# Patient Record
Sex: Female | Born: 1965 | Race: White | Hispanic: No | Marital: Single | Smoking: Never smoker
Health system: Southern US, Community
[De-identification: ages and names within clinical notes are randomized; demographics above are authoritative.]

## PROBLEM LIST (undated history)

## (undated) DIAGNOSIS — I10 Essential (primary) hypertension: Secondary | ICD-10-CM

## (undated) DIAGNOSIS — R109 Unspecified abdominal pain: Secondary | ICD-10-CM

---

## 2013-10-17 ENCOUNTER — Emergency Department (HOSPITAL_COMMUNITY): Payer: BC Managed Care – PPO

## 2013-10-17 ENCOUNTER — Inpatient Hospital Stay (HOSPITAL_COMMUNITY)
Admission: EM | Admit: 2013-10-17 | Discharge: 2013-10-19 | DRG: 897 | Disposition: A | Discharge status: Home / Self Care | Payer: BC Managed Care – PPO | Attending: Internal Medicine | Admitting: Internal Medicine

## 2013-10-17 ENCOUNTER — Encounter (HOSPITAL_COMMUNITY): Payer: Self-pay | Admitting: Emergency Medicine

## 2013-10-17 DIAGNOSIS — I959 Hypotension, unspecified: Secondary | ICD-10-CM | POA: Diagnosis present

## 2013-10-17 DIAGNOSIS — IMO0001 Reserved for inherently not codable concepts without codable children: Secondary | ICD-10-CM | POA: Diagnosis present

## 2013-10-17 DIAGNOSIS — F10929 Alcohol use, unspecified with intoxication, unspecified: Secondary | ICD-10-CM

## 2013-10-17 DIAGNOSIS — R55 Syncope and collapse: Secondary | ICD-10-CM | POA: Diagnosis present

## 2013-10-17 DIAGNOSIS — I498 Other specified cardiac arrhythmias: Secondary | ICD-10-CM | POA: Diagnosis present

## 2013-10-17 DIAGNOSIS — F1020 Alcohol dependence, uncomplicated: Secondary | ICD-10-CM | POA: Diagnosis present

## 2013-10-17 DIAGNOSIS — E860 Dehydration: Secondary | ICD-10-CM | POA: Diagnosis present

## 2013-10-17 DIAGNOSIS — F10229 Alcohol dependence with intoxication, unspecified: Principal | ICD-10-CM | POA: Diagnosis present

## 2013-10-17 DIAGNOSIS — F39 Unspecified mood [affective] disorder: Secondary | ICD-10-CM | POA: Diagnosis present

## 2013-10-17 DIAGNOSIS — D649 Anemia, unspecified: Secondary | ICD-10-CM | POA: Diagnosis present

## 2013-10-17 DIAGNOSIS — N179 Acute kidney failure, unspecified: Secondary | ICD-10-CM | POA: Diagnosis present

## 2013-10-17 DIAGNOSIS — F329 Major depressive disorder, single episode, unspecified: Secondary | ICD-10-CM

## 2013-10-17 DIAGNOSIS — F32A Depression, unspecified: Secondary | ICD-10-CM

## 2013-10-17 DIAGNOSIS — I10 Essential (primary) hypertension: Secondary | ICD-10-CM | POA: Diagnosis present

## 2013-10-17 DIAGNOSIS — R110 Nausea: Secondary | ICD-10-CM | POA: Diagnosis present

## 2013-10-17 HISTORY — DX: Essential (primary) hypertension: I10

## 2013-10-17 LAB — CBC
HCT: 38.5 % (ref 36.0–46.0)
Hemoglobin: 13.2 g/dL (ref 12.0–15.0)
MCH: 32.1 pg (ref 26.0–34.0)
MCHC: 34.3 g/dL (ref 30.0–36.0)
MCV: 93.7 fL (ref 78.0–100.0)
PLATELETS: 301 10*3/uL (ref 150–400)
RBC: 4.11 MIL/uL (ref 3.87–5.11)
RDW: 12.6 % (ref 11.5–15.5)
WBC: 7.1 10*3/uL (ref 4.0–10.5)

## 2013-10-17 LAB — POCT I-STAT, CHEM 8
BUN: 16 mg/dL (ref 6–23)
Calcium, Ion: 1.1 mmol/L — ABNORMAL LOW (ref 1.12–1.23)
Chloride: 101 mEq/L (ref 96–112)
Creatinine, Ser: 1.7 mg/dL — ABNORMAL HIGH (ref 0.50–1.10)
GLUCOSE: 115 mg/dL — AB (ref 70–99)
HEMATOCRIT: 41 % (ref 36.0–46.0)
Hemoglobin: 13.9 g/dL (ref 12.0–15.0)
POTASSIUM: 4.9 meq/L (ref 3.7–5.3)
SODIUM: 138 meq/L (ref 137–147)
TCO2: 24 mmol/L (ref 0–100)

## 2013-10-17 LAB — CG4 I-STAT (LACTIC ACID): Lactic Acid, Venous: 4.6 mmol/L — ABNORMAL HIGH (ref 0.5–2.2)

## 2013-10-17 MED ORDER — SODIUM CHLORIDE 0.9 % IV BOLUS (SEPSIS)
1000.0000 mL | Freq: Once | INTRAVENOUS | Status: AC
  Administered 2013-10-17: 1000 mL via INTRAVENOUS

## 2013-10-17 MED ORDER — SODIUM CHLORIDE 0.9 % IV BOLUS (SEPSIS)
1000.0000 mL | Freq: Once | INTRAVENOUS | Status: AC
  Administered 2013-10-18: 1000 mL via INTRAVENOUS

## 2013-10-17 MED ORDER — ONDANSETRON HCL 4 MG/2ML IJ SOLN
4.0000 mg | Freq: Once | INTRAMUSCULAR | Status: AC
  Administered 2013-10-18: 4 mg via INTRAVENOUS
  Filled 2013-10-17: qty 2

## 2013-10-17 NOTE — ED Notes (Addendum)
Opitz EDP made aware of CG4 Lactic results.

## 2013-10-17 NOTE — ED Notes (Signed)
Bed: RESA Expected date:  Expected time:  Means of arrival:  Comments: EMS dec loc

## 2013-10-17 NOTE — ED Notes (Signed)
EKG given to EDP. Opitz,MD. For review.

## 2013-10-17 NOTE — ED Notes (Signed)
EMS was called after the patient passed out at a concert. Patient was unresponsive and hypotensive when EMS arrived.

## 2013-10-17 NOTE — ED Provider Notes (Signed)
CSN: [id number]     Arrival date & time 10/17/13  2300 History   First MD Initiated Contact with Patient 10/17/13 2301     Chief Complaint  Patient presents with  . Alcohol Intoxication  . Hypotension     (Consider location/radiation/quality/duration/timing/severity/associated sxs/prior Treatment) HPI Hx per EMS and sig other bedside, at a country western music concert tonight, drinking vodka about 8 shots over the course of 4-5 hours when she became altered and "went out" x 2, witnessed by boyfriend.  He does not believe that she hit her head or used any drugs tonight.  EMS was called and PT was noted to be hypotensive. No vomiting or diarrhea, she did not respond to narcan, somewhat more responsive on arrival to the ED, remains hypotensive despite IVFs in route. PT provides minimal history, admit to alcohol use, denies drugs, took her BP medicine yesterday am, denies pain or trauma, feels nauseated.  No past medical history on file. No past surgical history on file. No family history on file. History  Substance Use Topics  . Smoking status: Not on file  . Smokeless tobacco: Not on file  . Alcohol Use: Not on file   OB History   No data available     Review of Systems  Constitutional: Negative for diaphoresis.  Respiratory: Negative for shortness of breath.   Cardiovascular: Negative for chest pain.  Gastrointestinal: Negative for abdominal pain.  Genitourinary: Negative for dysuria.  Musculoskeletal: Negative for back pain and neck stiffness.  Skin: Negative for rash.  Neurological: Negative for seizures.  All other systems reviewed and are negative.      Allergies  Review of patient's allergies indicates not on file.  Home Medications  No current outpatient prescriptions on file. BP 86/54  Pulse 89  Temp(Src) 97.6 F (36.4 C) (Oral)  Resp 18  SpO2 95% Physical Exam  Constitutional: She is oriented to person, place, and time. She appears well-developed and  well-nourished.  HENT:  Head: Normocephalic and atraumatic.  Mouth/Throat: Oropharynx is clear and moist.  Eyes: EOM are normal. Pupils are equal, round, and reactive to light. No scleral icterus.  Neck: Neck supple.  Cardiovascular: Normal rate, regular rhythm and intact distal pulses.   Hypotensive 86/54  Pulmonary/Chest: Effort normal and breath sounds normal. No respiratory distress.  Abdominal: Soft. Bowel sounds are normal. She exhibits no distension and no mass. There is no tenderness. There is no rebound and no guarding.  Musculoskeletal: Normal range of motion. She exhibits no edema and no tenderness.  Neurological: She is alert and oriented to person, place, and time. No cranial nerve deficit.  Speech slow, is alert and oriented, aware that she is in a hospital, not sure which one, does not recall all events tonight.   Skin: Skin is warm and dry.    ED Course  Procedures (including critical care time) Labs Review Labs Reviewed  ETHANOL - Abnormal; Notable for the following:    Alcohol, Ethyl (B) 199 (*)    All other components within normal limits  COMPREHENSIVE METABOLIC PANEL - Abnormal; Notable for the following:    Potassium 3.2 (*)    Glucose, Bld 115 (*)    Creatinine, Ser 1.40 (*)    GFR calc non Af Amer 44 (*)    GFR calc Af Amer 51 (*)    All other components within normal limits  URINALYSIS, ROUTINE W REFLEX MICROSCOPIC - Abnormal; Notable for the following:    Hgb urine dipstick MODERATE (*)  All other components within normal limits  URINE MICROSCOPIC-ADD ON - Abnormal; Notable for the following:    Casts HYALINE CASTS (*)    All other components within normal limits  CG4 I-STAT (LACTIC ACID) - Abnormal; Notable for the following:    Lactic Acid, Venous 4.60 (*)    All other components within normal limits  POCT I-STAT, CHEM 8 - Abnormal; Notable for the following:    Creatinine, Ser 1.70 (*)    Glucose, Bld 115 (*)    Calcium, Ion 1.10 (*)    All  other components within normal limits  CBC  URINE RAPID DRUG SCREEN (HOSP PERFORMED)   Imaging Review Ct Head Wo Contrast  10/17/2013   CLINICAL DATA:  Syncope and  EXAM: CT HEAD WITHOUT CONTRAST  TECHNIQUE: Contiguous axial images were obtained from the base of the skull through the vertex without intravenous contrast.  COMPARISON:  None available  FINDINGS: There is no acute intracranial hemorrhage or infarct. No mass lesion or midline shift. Gray-white matter differentiation is well maintained. Ventricles are normal in size without evidence of hydrocephalus. CSF containing spaces are within normal limits. No extra-axial fluid collection.  The calvarium is intact.  Orbital soft tissues are within normal limits.  The paranasal sinuses and mastoid air cells are well pneumatized and free of fluid.  Scalp soft tissues are unremarkable.  IMPRESSION: No acute intracranial process.   Electronically Signed   By: Rise Mu M.D.   On: 10/17/2013 23:54   Dg Chest Portable 1 View  10/18/2013   CLINICAL DATA:  Alcohol intoxication, hypotension  EXAM: PORTABLE CHEST - 1 VIEW  COMPARISON:  None.  FINDINGS: Very low inspiratory volumes with scattered perihilar and bibasilar atelectasis. Mild diffuse interstitial prominence. Cardiac and mediastinal contours are likely within normal limits given low lung volumes and portable frontal technique. No definite airspace consolidation pleural effusion or pneumothorax. No acute osseous abnormality.  IMPRESSION: Very low inspiratory volumes with scattered perihilar and bibasilar atelectasis. Superimposed infiltrate her aspiration is difficult to exclude entirely.  Recommend dedicated PA and lateral chest x-ray when the patient is clinically able.   Electronically Signed   By: Malachy Moan M.D.   On: 10/18/2013 00:38     Date: 10/17/2013  Rate: 87  Rhythm: normal sinus rhythm  QRS Axis: normal  Intervals: normal  ST/T Wave abnormalities: nonspecific ST  changes  Conduction Disutrbances:none  Narrative Interpretation:   Old EKG Reviewed: none available   CRITICAL CARE Performed by: Filomena Pokorney Total critical care time: 45 Critical care time was exclusive of separately billable procedures and treating other patients. Critical care was necessary to treat or prevent imminent or life-threatening deterioration. Critical care was time spent personally by me on the following activities: development of treatment plan with patient and/or surrogate as well as nursing, discussions with consultants, evaluation of patient's response to treatment, examination of patient, obtaining history from patient or surrogate, ordering and performing treatments and interventions, ordering and review of laboratory studies, ordering and review of radiographic studies, pulse oximetry and re-evaluation of patient's condition. IV zofran. IV fluids provided for hypertension x3 L, remains hypotensive systolic blood pressure 88-90s. Discussed with hospitalist on-call, Dr. Allena Katz. Given clinical presentation, alcohol consumption, and patient slowly becoming more arousable and continues to deny any symptoms, will continue IV fluids and observed in the ED. 5:15 AM heart rate 103, systolic blood pressure 91 after 5 L of IV fluids. No vomiting in ED. No ABD pain. No abdominal tenderness on serial  exams. Plan admit telemetry observation.    MDM   Diagnosis: alcohol intoxication, hypotension  IV fluid resuscitation, remains borderline hypotensive EKG, chest x-ray, labs, UA, chest x-ray, CT brain MED admit    Sunnie Nielsen, MD 10/18/13 [phone]

## 2013-10-18 ENCOUNTER — Observation Stay (HOSPITAL_COMMUNITY): Payer: BC Managed Care – PPO

## 2013-10-18 ENCOUNTER — Encounter (HOSPITAL_COMMUNITY): Payer: Self-pay | Admitting: Unknown Physician Specialty

## 2013-10-18 DIAGNOSIS — I959 Hypotension, unspecified: Secondary | ICD-10-CM | POA: Diagnosis present

## 2013-10-18 DIAGNOSIS — F1010 Alcohol abuse, uncomplicated: Secondary | ICD-10-CM

## 2013-10-18 DIAGNOSIS — I10 Essential (primary) hypertension: Secondary | ICD-10-CM

## 2013-10-18 LAB — PRO B NATRIURETIC PEPTIDE: Pro B Natriuretic peptide (BNP): 138.1 pg/mL — ABNORMAL HIGH (ref 0–125)

## 2013-10-18 LAB — COMPREHENSIVE METABOLIC PANEL
ALK PHOS: 71 U/L (ref 39–117)
ALT: 18 U/L (ref 0–35)
ALT: 16 U/L (ref 0–35)
AST: 23 U/L (ref 0–37)
AST: 20 U/L (ref 0–37)
Albumin: 4 g/dL (ref 3.5–5.2)
Albumin: 3.7 g/dL (ref 3.5–5.2)
Alkaline Phosphatase: 72 U/L (ref 39–117)
BILIRUBIN TOTAL: 0.3 mg/dL (ref 0.3–1.2)
BUN: 12 mg/dL (ref 6–23)
BUN: 8 mg/dL (ref 6–23)
CALCIUM: 8.2 mg/dL — AB (ref 8.4–10.5)
CO2: 19 meq/L (ref 19–32)
CO2: 20 meq/L (ref 19–32)
CREATININE: 0.95 mg/dL (ref 0.50–1.10)
Calcium: 9.4 mg/dL (ref 8.4–10.5)
Chloride: 99 mEq/L (ref 96–112)
Chloride: 104 mEq/L (ref 96–112)
Creatinine, Ser: 1.4 mg/dL — ABNORMAL HIGH (ref 0.50–1.10)
GFR calc Af Amer: 81 mL/min — ABNORMAL LOW (ref >90)
GFR calc non Af Amer: 70 mL/min — ABNORMAL LOW (ref >90)
GFR, EST AFRICAN AMERICAN: 51 mL/min — AB (ref >90)
GFR, EST NON AFRICAN AMERICAN: 44 mL/min — AB (ref >90)
GLUCOSE: 115 mg/dL — AB (ref 70–99)
Glucose, Bld: 82 mg/dL (ref 70–99)
Potassium: 3.2 mEq/L — ABNORMAL LOW (ref 3.7–5.3)
Potassium: 3.8 mEq/L (ref 3.7–5.3)
Sodium: 138 mEq/L (ref 137–147)
Sodium: 140 mEq/L (ref 137–147)
TOTAL PROTEIN: 6.9 g/dL (ref 6.0–8.3)
Total Bilirubin: 0.3 mg/dL (ref 0.3–1.2)
Total Protein: 6.6 g/dL (ref 6.0–8.3)

## 2013-10-18 LAB — URINALYSIS, ROUTINE W REFLEX MICROSCOPIC
Bilirubin Urine: NEGATIVE
Glucose, UA: NEGATIVE mg/dL
Ketones, ur: NEGATIVE mg/dL
LEUKOCYTES UA: NEGATIVE
Nitrite: NEGATIVE
Protein, ur: NEGATIVE mg/dL
SPECIFIC GRAVITY, URINE: 1.013 (ref 1.005–1.030)
UROBILINOGEN UA: 0.2 mg/dL (ref 0.0–1.0)
pH: 6 (ref 5.0–8.0)

## 2013-10-18 LAB — CBC WITH DIFFERENTIAL/PLATELET
BASOS ABS: 0 10*3/uL (ref 0.0–0.1)
Basophils Relative: 0 % (ref 0–1)
EOS PCT: 0 % (ref 0–5)
Eosinophils Absolute: 0 10*3/uL (ref 0.0–0.7)
HCT: 34.2 % — ABNORMAL LOW (ref 36.0–46.0)
Hemoglobin: 11.8 g/dL — ABNORMAL LOW (ref 12.0–15.0)
LYMPHS PCT: 20 % (ref 12–46)
Lymphs Abs: 1.6 10*3/uL (ref 0.7–4.0)
MCH: 32 pg (ref 26.0–34.0)
MCHC: 34.5 g/dL (ref 30.0–36.0)
MCV: 92.7 fL (ref 78.0–100.0)
MONO ABS: 0.3 10*3/uL (ref 0.1–1.0)
MONOS PCT: 5 % (ref 3–12)
Neutro Abs: 5.7 10*3/uL (ref 1.7–7.7)
Neutrophils Relative %: 74 % (ref 43–77)
Platelets: 307 10*3/uL (ref 150–400)
RBC: 3.69 MIL/uL — ABNORMAL LOW (ref 3.87–5.11)
RDW: 12.6 % (ref 11.5–15.5)
WBC: 7.6 10*3/uL (ref 4.0–10.5)

## 2013-10-18 LAB — RAPID URINE DRUG SCREEN, HOSP PERFORMED
AMPHETAMINES: NOT DETECTED
Barbiturates: NOT DETECTED
Benzodiazepines: NOT DETECTED
COCAINE: NOT DETECTED
OPIATES: NOT DETECTED
Tetrahydrocannabinol: NOT DETECTED

## 2013-10-18 LAB — ETHANOL: Alcohol, Ethyl (B): 199 mg/dL — ABNORMAL HIGH (ref 0–11)

## 2013-10-18 LAB — TROPONIN I
Troponin I: <0.3 ng/mL (ref <0.30)
Troponin I: <0.3 ng/mL (ref <0.30)

## 2013-10-18 LAB — TSH: TSH: 2.375 u[IU]/mL (ref 0.350–4.500)

## 2013-10-18 LAB — LIPASE, BLOOD: Lipase: 67 U/L — ABNORMAL HIGH (ref 11–59)

## 2013-10-18 LAB — LACTIC ACID, PLASMA: Lactic Acid, Venous: 2.2 mmol/L (ref 0.5–2.2)

## 2013-10-18 LAB — URINE MICROSCOPIC-ADD ON

## 2013-10-18 LAB — AMYLASE: AMYLASE: 96 U/L (ref 0–105)

## 2013-10-18 MED ORDER — ONDANSETRON HCL 4 MG PO TABS: 4.0000 mg | ORAL_TABLET | Freq: Four times a day (QID) | ORAL | Status: DC | PRN

## 2013-10-18 MED ORDER — SODIUM CHLORIDE 0.9 % IJ SOLN
3.0000 mL | Freq: Two times a day (BID) | INTRAMUSCULAR | Status: DC
  Administered 2013-10-18: 3 mL via INTRAVENOUS

## 2013-10-18 MED ORDER — HEPARIN SODIUM (PORCINE) 5000 UNIT/ML IJ SOLN
5000.0000 [IU] | Freq: Three times a day (TID) | INTRAMUSCULAR | Status: DC
  Administered 2013-10-18: 5000 [IU] via SUBCUTANEOUS
  Filled 2013-10-18 (×4): qty 1

## 2013-10-18 MED ORDER — ONDANSETRON HCL 4 MG/2ML IJ SOLN: 4.0000 mg | Freq: Four times a day (QID) | INTRAMUSCULAR | Status: DC | PRN

## 2013-10-18 MED ORDER — THIAMINE HCL 100 MG/ML IJ SOLN
100.0000 mg | Freq: Every day | INTRAMUSCULAR | Status: DC
  Filled 2013-10-18 (×2): qty 1

## 2013-10-18 MED ORDER — POTASSIUM CHLORIDE 10 MEQ/100ML IV SOLN
10.0000 meq | Freq: Once | INTRAVENOUS | Status: AC
  Administered 2013-10-18: 10 meq via INTRAVENOUS
  Filled 2013-10-18: qty 100

## 2013-10-18 MED ORDER — SODIUM CHLORIDE 0.9 % IV SOLN
INTRAVENOUS | Status: DC
  Administered 2013-10-18 – 2013-10-19 (×2): via INTRAVENOUS

## 2013-10-18 MED ORDER — LORAZEPAM 2 MG/ML IJ SOLN: 1.0000 mg | Freq: Four times a day (QID) | INTRAMUSCULAR | Status: DC | PRN

## 2013-10-18 MED ORDER — VITAMIN B-1 100 MG PO TABS
100.0000 mg | ORAL_TABLET | Freq: Every day | ORAL | Status: DC
  Administered 2013-10-18 – 2013-10-19 (×2): 100 mg via ORAL
  Filled 2013-10-18 (×2): qty 1

## 2013-10-18 MED ORDER — ACETAMINOPHEN 325 MG PO TABS: 650.0000 mg | ORAL_TABLET | Freq: Four times a day (QID) | ORAL | Status: DC | PRN

## 2013-10-18 MED ORDER — FOLIC ACID 1 MG PO TABS
1.0000 mg | ORAL_TABLET | Freq: Every day | ORAL | Status: DC
  Administered 2013-10-18 – 2013-10-19 (×2): 1 mg via ORAL
  Filled 2013-10-18 (×2): qty 1

## 2013-10-18 MED ORDER — ADULT MULTIVITAMIN W/MINERALS CH
1.0000 | ORAL_TABLET | Freq: Every day | ORAL | Status: DC
  Administered 2013-10-18 – 2013-10-19 (×2): 1 via ORAL
  Filled 2013-10-18 (×2): qty 1

## 2013-10-18 MED ORDER — SODIUM CHLORIDE 0.9 % IV SOLN
INTRAVENOUS | Status: DC
  Administered 2013-10-18: 06:00:00 via INTRAVENOUS

## 2013-10-18 MED ORDER — LORAZEPAM 1 MG PO TABS: 1.0000 mg | ORAL_TABLET | Freq: Four times a day (QID) | ORAL | Status: DC | PRN

## 2013-10-18 MED ORDER — SODIUM CHLORIDE 0.9 % IV SOLN: INTRAVENOUS | Status: DC

## 2013-10-18 MED ORDER — VENLAFAXINE HCL ER 75 MG PO CP24
75.0000 mg | ORAL_CAPSULE | Freq: Every day | ORAL | Status: DC
  Administered 2013-10-18 – 2013-10-19 (×2): 75 mg via ORAL
  Filled 2013-10-18 (×2): qty 1

## 2013-10-18 MED ORDER — KETOROLAC TROMETHAMINE 30 MG/ML IJ SOLN
30.0000 mg | Freq: Once | INTRAMUSCULAR | Status: AC
  Administered 2013-10-18: 30 mg via INTRAVENOUS
  Filled 2013-10-18: qty 1

## 2013-10-18 MED ORDER — ACETAMINOPHEN 650 MG RE SUPP: 650.0000 mg | Freq: Four times a day (QID) | RECTAL | Status: DC | PRN

## 2013-10-18 NOTE — ED Notes (Signed)
Patient transported to X-ray 

## 2013-10-18 NOTE — ED Notes (Signed)
Attempted to call report to floor 

## 2013-10-18 NOTE — ED Notes (Addendum)
Notified RN, Cheryl Tapia pt. Blood pressure low 86/54. MD, Dierdre Highmanpitz made aware.

## 2013-10-18 NOTE — ED Notes (Signed)
Will recollect Lactic Acid

## 2013-10-18 NOTE — ED Notes (Signed)
MD Devine at bedside 

## 2013-10-18 NOTE — H&P (Addendum)
[facility] History and Physical  Patient: Cheryl PressmanJennifer Tapia  XBJ:[id number]  DOB: 11-28-1965  DOS: the patient was seen and examined on 10/18/2013 PCP: No primary provider on file.  Chief Complaint: Syncope  HPI: Cheryl PressmanJennifer Tapia is a 48 y.o. female with Past medical history of hypertension and mood disorder. The patient is coming from home The patient presented with an episode of syncope. She was at a concert and has been drinking alcohol since afternoon. She had 4-6 workup shocks and 1 beer. She was at a concert in the afternoon and was standing MC suddenly collapsed. This was a witnessed event and her significant other was with her who did not witness any seizure like activity, incontinence of bowel or bladder, prior confusion. The patient denies using any illicit drugs. She has taken her antihypertensive medication. She mentioned at her baseline her blood pressure is generally not well controlled. She mentions she had a history of pancreas issues in the past and has been given tramadol which she has taken on Tuesday and Wednesday as she was having a routine abdominal pain. She describes her pain due to pancreas as epigastric in region and radiating to her back and feels like burning. She denies any nausea vomiting or any type of abdominal pain at this point on my evaluation. Pt denies any fever, chills, headache, cough, chest pain, palpitation, shortness of breath, orthopnea, PND, nausea, vomiting, abdominal pain, diarrhea, constipation, active bleeding, burning urination, dizziness, pedal edema,  focal neurological deficit.  Her last menstrual period was 2 years ago.  Review of Systems: as mentioned in the history of present illness.  A Comprehensive review of the other systems is negative.  Past Medical History  Diagnosis Date  . Hypertension    No past surgical history on file. Social History:  reports that she has never smoked. She does not have any smokeless tobacco history on  file. She reports that she drinks about 6.6 ounces of alcohol per week. She reports that she does not use illicit drugs. Independent for most of her  ADL.  No Known Allergies  No family history on file.  Prior to Admission medications   Medication Sig Start Date End Date Taking? Authorizing Provider  Olmesartan-Amlodipine-HCTZ (TRIBENZOR PO) Take by mouth.   Yes Historical Provider, MD  PRESCRIPTION MEDICATION    Yes Historical Provider, MD  venlafaxine XR (EFFEXOR-XR) 75 MG 24 hr capsule Take 75 mg by mouth daily with breakfast.   Yes Historical Provider, MD    Physical Exam: Filed Vitals:   10/18/13 0407 10/18/13 0415 10/18/13 0430 10/18/13 0445  BP: 98/58 93/56 88/56  99/60  Pulse:  121 105 103  Temp:      TempSrc:      Resp:  17 18 15   SpO2:  96% 97% 98%    General: Alert, Awake and Oriented to Time, Place and Person. Appear in moderate distress Eyes: PERRL ENT: Oral Mucosa clear dry. Neck:  no  JVD Cardiovascular: S1 and S2 Present,  No  Murmur, Peripheral Pulses Present Respiratory: Bilateral Air entry equal and Decreased, Clear to Auscultation,   no  Crackles, no  wheezes Abdomen: Bowel Sound Present, Soft and Non tender Skin:  no  Rash Extremities:  no  Pedal edema,  no  calf tenderness Neurologic: Grossly Unremarkable.  Labs on Admission:  CBC:  Recent Labs Lab 10/17/13 2315 10/17/13 2347  WBC 7.1  --   HGB 13.2 13.9  HCT 38.5 41.0  MCV 93.7  --   PLT  301  --     CMP     Component Value Date/Time   NA 138 10/17/2013 2347   K 4.9 10/17/2013 2347   CL 101 10/17/2013 2347   CO2 19 10/17/2013 2315   GLUCOSE 115* 10/17/2013 2347   BUN 16 10/17/2013 2347   CREATININE 1.70* 10/17/2013 2347   CALCIUM 9.4 10/17/2013 2315   PROT 6.9 10/17/2013 2315   ALBUMIN 4.0 10/17/2013 2315   AST 23 10/17/2013 2315   ALT 18 10/17/2013 2315   ALKPHOS 72 10/17/2013 2315   BILITOT 0.3 10/17/2013 2315   GFRNONAA 44* 10/17/2013 2315   GFRAA 51* 10/17/2013 2315    No results found  for this basename: LIPASE, AMYLASE,  in the last 168 hours No results found for this basename: AMMONIA,  in the last 168 hours  No results found for this basename: CKTOTAL, CKMB, CKMBINDEX, TROPONINI,  in the last 168 hours BNP (last 3 results) No results found for this basename: PROBNP,  in the last 8760 hours  Radiological Exams on Admission: Ct Head Wo Contrast  10/17/2013   CLINICAL DATA:  Syncope and  EXAM: CT HEAD WITHOUT CONTRAST  TECHNIQUE: Contiguous axial images were obtained from the base of the skull through the vertex without intravenous contrast.  COMPARISON:  None available  FINDINGS: There is no acute intracranial hemorrhage or infarct. No mass lesion or midline shift. Gray-white matter differentiation is well maintained. Ventricles are normal in size without evidence of hydrocephalus. CSF containing spaces are within normal limits. No extra-axial fluid collection.  The calvarium is intact.  Orbital soft tissues are within normal limits.  The paranasal sinuses and mastoid air cells are well pneumatized and free of fluid.  Scalp soft tissues are unremarkable.  IMPRESSION: No acute intracranial process.   Electronically Signed   By: Rise Mu M.D.   On: 10/17/2013 23:54   Dg Chest Portable 1 View  10/18/2013   CLINICAL DATA:  Alcohol intoxication, hypotension  EXAM: PORTABLE CHEST - 1 VIEW  COMPARISON:  None.  FINDINGS: Very low inspiratory volumes with scattered perihilar and bibasilar atelectasis. Mild diffuse interstitial prominence. Cardiac and mediastinal contours are likely within normal limits given low lung volumes and portable frontal technique. No definite airspace consolidation pleural effusion or pneumothorax. No acute osseous abnormality.  IMPRESSION: Very low inspiratory volumes with scattered perihilar and bibasilar atelectasis. Superimposed infiltrate her aspiration is difficult to exclude entirely.  Recommend dedicated PA and lateral chest x-ray when the patient  is clinically able.   Electronically Signed   By: Malachy Moan M.D.   On: 10/18/2013 00:38    EKG: Independently reviewed. sinus tachycardia. Repeat EKG shows T-wave inversions in V3 V4 and lead 3 which appears nonspecific, borderline prolonged QT 500.  Assessment/Plan Active Problems:   Hypotension   1. SIRS/hypovolemic shock The patient is presenting with an episode of syncope. She was hypotensive on EMS arrival will give her 500 cc of fluid. She has received nearly 3.5 L of fluid in the ED despite which she continues to remain hypotensive and 90s and tachycardic with sinus tachycardia. Despite aggressive IV resuscitation she still has tachycardia and hypotension. 4 At present she does not have any acute complaints more likely her hypotension is due to hypovolemia. I would continue to hydrate her and obtain lipase troponin in addition to routine blood work. A urine does not show any signs of abnormality. a portable chest x-ray shows interstitial prominence possible aspiration. Since nausea is more awake I  would obtain a PA lateral x-ray. IV Protonix for aspiration pneumonitis I do not suspect she has pneumonia. At present I would hold her antihypertensive medication and she may require slow reintroduction on discharge.   DVT Prophylaxis: subcutaneous Heparin Nutrition:  regular diet   Code Status:  full   Family Communication:  significant other was present at bedside, opportunity was given to ask question and all questions were answered satisfactorily at the time of interview. Disposition: Admitted to observation in telemetry unit.  Author: Lynden Oxford, MD [facility] Pager: [phone] 10/18/2013, 5:50 AM    If 7PM-7AM, please contact night-coverage [email or web address] Password [email or web address]

## 2013-10-18 NOTE — Progress Notes (Signed)
[facility] PROGRESS NOTE  Cheryl PressmanJennifer Tapia ZOX:[id number] DOB: [birth year] DOA: 10/17/2013 PCP: No primary provider on file.  Brief narrative: 48 year old female with no significant past medical history who was admitted for evaluation of syncopal events. She was found to have alcohol level of 199. BP on admission was 68/54 but with 4 L of IV fluids it improved to 121/76. CT head did not show acute intracranial findings. CXR did not show acute cardiopulmonary process. Her lipase level was 67 on the admission but she did not complain of abdominal pain. UDS was WNL. Her creatinine was 1.4 on the admission but it normalized with IV fluids   Assessment/Plan:  Principal Problem: Syncope - likely due to acute alcohol intoxication and hypotension - BP better this am - no lightheadedness this am - continue IV fluids - PO intake as tolerated Active Problems: Acute alcohol intoxication - last alcoholic beverage was at 9  pm last night  - CIWA protocol order in place - continue IVF, MVI, thiamine and folic acid Acute renal failure - likely due to dehydration - resolved with IV Fluids  Code Status: full code  Family Communication: husband at the bedside Disposition Plan: home likely in next 24 hours  Manson PasseyEVINE, [name], MD  [facility] Pager [phone]  If 7PM-7AM, please contact night-coverage [email or web address] Password [email or web address] 10/18/2013, 12:07 PM   LOS: 1 day   Consultants:  None   Procedures:  None   Antibiotics:  None   HPI/Subjective: Better this am.  Objective: Filed Vitals:   10/18/13 0955 10/18/13 1010 10/18/13 1030 10/18/13 1104  BP:  118/70 109/69 121/76  Pulse:   92   Temp:    98.8 F (37.1 C)  TempSrc:      Resp: 15 15 18 18   SpO2:   98% 97%    Intake/Output Summary (Last 24 hours) at 10/18/13 1207 Last data filed at 10/18/13 0500  Gross per 24 hour  Intake      0 ml  Output    550 ml  Net   -550 ml    Exam:   General:  Pt is alert, follows  commands appropriately, not in acute distress  Cardiovascular: Regular rate and rhythm, S1/S2, no murmurs, no rubs, no gallops  Respiratory: Clear to auscultation bilaterally, no wheezing, no crackles, no rhonchi  Abdomen: Soft, non tender, non distended, bowel sounds present, no guarding  Extremities: No edema, pulses DP and PT palpable bilaterally  Neuro: Grossly nonfocal  Data Reviewed: Basic Metabolic Panel:  Recent Labs Lab 10/17/13 2315 10/17/13 2347 10/18/13 0641  NA 138 138 140  K 3.2* 4.9 3.8  CL 99 101 104  CO2 19  --  20  GLUCOSE 115* 115* 82  BUN 12 16 8   CREATININE 1.40* 1.70* 0.95  CALCIUM 9.4  --  8.2*   Liver Function Tests:  Recent Labs Lab 10/17/13 2315 10/18/13 0641  AST 23 20  ALT 18 16  ALKPHOS 72 71  BILITOT 0.3 0.3  PROT 6.9 6.6  ALBUMIN 4.0 3.7    Recent Labs Lab 10/18/13 0641  LIPASE 67*  AMYLASE 96   No results found for this basename: AMMONIA,  in the last 168 hours CBC:  Recent Labs Lab 10/17/13 2315 10/17/13 2347 10/18/13 0641  WBC 7.1  --  7.6  NEUTROABS  --   --  5.7  HGB 13.2 13.9 11.8*  HCT 38.5 41.0 34.2*  MCV 93.7  --  92.7  PLT 301  --  307   Cardiac Enzymes:  Recent Labs Lab 10/18/13 0641  TROPONINI <0.30   BNP: No components found with this basename: POCBNP,  CBG: No results found for this basename: GLUCAP,  in the last 168 hours  No results found for this or any previous visit (from the past 240 hour(s)).   Studies: Dg Chest 2 View  10/18/2013   CLINICAL DATA:  Followup atelectasis  EXAM: CHEST  2 VIEW  COMPARISON:  10/18/2013  FINDINGS: Lungs are clear. Cardiomediastinal silhouette is within normal. There is mild spondylosis of the spine. There is mild anterior wedging of a upper lumbar vertebral body.  IMPRESSION: No active cardiopulmonary disease.  Mild anterior wedging of an upper lumbar vertebral body.   Electronically Signed   By: Elberta Fortis M.D.   On: 10/18/2013 07:02   Ct Head Wo  Contrast  10/17/2013   CLINICAL DATA:  Syncope and  EXAM: CT HEAD WITHOUT CONTRAST  TECHNIQUE: Contiguous axial images were obtained from the base of the skull through the vertex without intravenous contrast.  COMPARISON:  None available  FINDINGS: There is no acute intracranial hemorrhage or infarct. No mass lesion or midline shift. Gray-white matter differentiation is well maintained. Ventricles are normal in size without evidence of hydrocephalus. CSF containing spaces are within normal limits. No extra-axial fluid collection.  The calvarium is intact.  Orbital soft tissues are within normal limits.  The paranasal sinuses and mastoid air cells are well pneumatized and free of fluid.  Scalp soft tissues are unremarkable.  IMPRESSION: No acute intracranial process.   Electronically Signed   By: Rise Mu M.D.   On: 10/17/2013 23:54   Dg Chest Portable 1 View  10/18/2013   CLINICAL DATA:  Alcohol intoxication, hypotension  EXAM: PORTABLE CHEST - 1 VIEW  COMPARISON:  None.  FINDINGS: Very low inspiratory volumes with scattered perihilar and bibasilar atelectasis. Mild diffuse interstitial prominence. Cardiac and mediastinal contours are likely within normal limits given low lung volumes and portable frontal technique. No definite airspace consolidation pleural effusion or pneumothorax. No acute osseous abnormality.  IMPRESSION: Very low inspiratory volumes with scattered perihilar and bibasilar atelectasis. Superimposed infiltrate her aspiration is difficult to exclude entirely.  Recommend dedicated PA and lateral chest x-ray when the patient is clinically able.   Electronically Signed   By: Malachy Moan M.D.   On: 10/18/2013 00:38    Scheduled Meds: . sodium chloride   Intravenous STAT  . heparin  5,000 Units Subcutaneous 3 times per day  . sodium chloride  3 mL Intravenous Q12H  . venlafaxine XR  75 mg Oral Daily   Continuous Infusions: . sodium chloride Stopped (10/18/13 1008)  .  sodium chloride

## 2013-10-19 DIAGNOSIS — R55 Syncope and collapse: Secondary | ICD-10-CM

## 2013-10-19 DIAGNOSIS — IMO0001 Reserved for inherently not codable concepts without codable children: Secondary | ICD-10-CM | POA: Diagnosis present

## 2013-10-19 DIAGNOSIS — F3289 Other specified depressive episodes: Secondary | ICD-10-CM

## 2013-10-19 DIAGNOSIS — F32A Depression, unspecified: Secondary | ICD-10-CM | POA: Diagnosis present

## 2013-10-19 DIAGNOSIS — F329 Major depressive disorder, single episode, unspecified: Secondary | ICD-10-CM | POA: Diagnosis present

## 2013-10-19 DIAGNOSIS — D649 Anemia, unspecified: Secondary | ICD-10-CM | POA: Diagnosis present

## 2013-10-19 DIAGNOSIS — I10 Essential (primary) hypertension: Secondary | ICD-10-CM | POA: Diagnosis present

## 2013-10-19 DIAGNOSIS — N179 Acute kidney failure, unspecified: Secondary | ICD-10-CM | POA: Diagnosis present

## 2013-10-19 DIAGNOSIS — F1020 Alcohol dependence, uncomplicated: Secondary | ICD-10-CM | POA: Diagnosis present

## 2013-10-19 LAB — COMPREHENSIVE METABOLIC PANEL
ALBUMIN: 3.1 g/dL — AB (ref 3.5–5.2)
ALT: 12 U/L (ref 0–35)
AST: 20 U/L (ref 0–37)
Alkaline Phosphatase: 64 U/L (ref 39–117)
BUN: 8 mg/dL (ref 6–23)
CO2: 22 mEq/L (ref 19–32)
Calcium: 7.9 mg/dL — ABNORMAL LOW (ref 8.4–10.5)
Chloride: 107 mEq/L (ref 96–112)
Creatinine, Ser: 0.8 mg/dL (ref 0.50–1.10)
GFR calc Af Amer: >90 mL/min (ref >90)
GFR calc non Af Amer: 86 mL/min — ABNORMAL LOW (ref >90)
Glucose, Bld: 87 mg/dL (ref 70–99)
Potassium: 3.9 mEq/L (ref 3.7–5.3)
SODIUM: 140 meq/L (ref 137–147)
TOTAL PROTEIN: 5.7 g/dL — AB (ref 6.0–8.3)
Total Bilirubin: 0.4 mg/dL (ref 0.3–1.2)

## 2013-10-19 LAB — CBC WITH DIFFERENTIAL/PLATELET
BASOS PCT: 0 % (ref 0–1)
Basophils Absolute: 0 10*3/uL (ref 0.0–0.1)
Eosinophils Absolute: 0.1 10*3/uL (ref 0.0–0.7)
Eosinophils Relative: 2 % (ref 0–5)
HEMATOCRIT: 31.9 % — AB (ref 36.0–46.0)
Hemoglobin: 11.1 g/dL — ABNORMAL LOW (ref 12.0–15.0)
Lymphocytes Relative: 30 % (ref 12–46)
Lymphs Abs: 1.5 10*3/uL (ref 0.7–4.0)
MCH: 32.6 pg (ref 26.0–34.0)
MCHC: 34.8 g/dL (ref 30.0–36.0)
MCV: 93.8 fL (ref 78.0–100.0)
MONO ABS: 0.4 10*3/uL (ref 0.1–1.0)
MONOS PCT: 9 % (ref 3–12)
Neutro Abs: 3 10*3/uL (ref 1.7–7.7)
Neutrophils Relative %: 59 % (ref 43–77)
Platelets: 277 10*3/uL (ref 150–400)
RBC: 3.4 MIL/uL — ABNORMAL LOW (ref 3.87–5.11)
RDW: 12.7 % (ref 11.5–15.5)
WBC: 5 10*3/uL (ref 4.0–10.5)

## 2013-10-19 LAB — MAGNESIUM: Magnesium: 1.7 mg/dL (ref 1.5–2.5)

## 2013-10-19 NOTE — Progress Notes (Signed)
  Echocardiogram 2D Echocardiogram has been performed.  Arvil ChacoFoster, Alicia Ackert 10/19/2013, 10:29 AM

## 2013-10-19 NOTE — Discharge Summary (Signed)
Physician Discharge Summary  Cheryl PressmanJennifer Tapia ZOX:[id number] DOB: 28-Jun-1966 DOA: 10/17/2013  PCP: No primary provider on file.  Admit date: 10/17/2013 Discharge date: 10/19/2013  Time spent: 40 minute  Recommendations for Outpatient Follow-up:   Syncope  - likely due to acute alcohol intoxication and hypotension  -Troponin x3 negative  -Echocardiogram; see below for results   Acute alcohol intoxication  - last alcoholic beverage was at 9 pm 8/112/12 - CIWA protocol order in place; has not required any Ativan during admission  -Safe for discharge  Acute renal failure  - likely due to dehydration  - resolved    Depression  -Continue Effexor 75 mg daily   HTN -Counseled patient to hold off on restarting her hypertension medication until she sees her PCP secondary to BP running low.      Discharge Diagnoses:  Active Problems:   Hypotension   Acute renal failure   Alcoholism /alcohol abuse   Depression   HTN (hypertension)   Anemia   Discharge Condition: Stable  Diet recommendation: Heart healthy  Filed Weights   10/19/13 0638  Weight: 81 kg (178 lb 9.2 oz)    History of present illness:  48 yo WF PMHx HTN, depression, alcoholism who was admitted for evaluation of syncopal events. She was found to have alcohol level of 199. BP on admission was 68/54 but with 4 L of IV fluids it improved to 121/76. CT head did not show acute intracranial findings. CXR did not show acute cardiopulmonary process. Her lipase level was 67 on the admission but she did not complain of abdominal pain. UDS was WNL. Her creatinine was 1.4 on the admission but it normalized with IV fluids 2/15 patient sitting in bed negative CP, negative SOB, negative N./V. ready for discharge.   Procedures: -Left ventricle: The cavity size was normal. Systolic function was normal.  LVEF= 50% to 55%. Wall motion was normal; there were no regional wall motion  abnormalities.     Consultations:    Antibiotics    Discharge Exam: Filed Vitals:   10/18/13 1030 10/18/13 1104 10/18/13 2200 10/19/13 0638  BP: 109/69 121/76 99/61 115/73  Pulse: 92  85 82  Temp:  98.8 F (37.1 C) 99.1 F (37.3 C) 98.7 F (37.1 C)  TempSrc:   Oral Oral  Resp: 18 18 18 18   Weight:    81 kg (178 lb 9.2 oz)  SpO2: 98% 97% 95% 95%    General: A./O. x4, NAD Cardiovascular: Regular in rate, negative murmurs rubs gallops, DP/PT pulse one plus bilateral Respiratory: Clear to auscultation bilateral  Discharge Instructions     Medication List    ASK your doctor about these medications       estradiol-norethindrone 1-0.5 MG per tablet  Commonly known as:  ACTIVELLA  Take 1 tablet by mouth daily.     sulfamethoxazole-trimethoprim 800-160 MG per tablet  Commonly known as:  BACTRIM DS     traMADol 50 MG tablet  Commonly known as:  ULTRAM     TRIBENZOR 40-5-12.5 MG Tabs  Generic drug:  Olmesartan-Amlodipine-HCTZ  Take 1 tablet by mouth daily.     venlafaxine XR 75 MG 24 hr capsule  Commonly known as:  EFFEXOR-XR  Take 75 mg by mouth daily with breakfast.       Allergies  Allergen Reactions  . Zithromax [Azithromycin] Other (See Comments)    Reaction: thrush      The results of significant diagnostics from this hospitalization (including imaging, microbiology, ancillary and laboratory)  are listed below for reference.    Significant Diagnostic Studies: Dg Chest 2 View  10/18/2013   CLINICAL DATA:  Followup atelectasis  EXAM: CHEST  2 VIEW  COMPARISON:  10/18/2013  FINDINGS: Lungs are clear. Cardiomediastinal silhouette is within normal. There is mild spondylosis of the spine. There is mild anterior wedging of a upper lumbar vertebral body.  IMPRESSION: No active cardiopulmonary disease.  Mild anterior wedging of an upper lumbar vertebral body.   Electronically Signed   By: Elberta Fortis M.D.   On: 10/18/2013 07:02   Ct Head Wo  Contrast  10/17/2013   CLINICAL DATA:  Syncope and  EXAM: CT HEAD WITHOUT CONTRAST  TECHNIQUE: Contiguous axial images were obtained from the base of the skull through the vertex without intravenous contrast.  COMPARISON:  None available  FINDINGS: There is no acute intracranial hemorrhage or infarct. No mass lesion or midline shift. Gray-white matter differentiation is well maintained. Ventricles are normal in size without evidence of hydrocephalus. CSF containing spaces are within normal limits. No extra-axial fluid collection.  The calvarium is intact.  Orbital soft tissues are within normal limits.  The paranasal sinuses and mastoid air cells are well pneumatized and free of fluid.  Scalp soft tissues are unremarkable.  IMPRESSION: No acute intracranial process.   Electronically Signed   By: Rise Mu M.D.   On: 10/17/2013 23:54   Dg Chest Portable 1 View  10/18/2013   CLINICAL DATA:  Alcohol intoxication, hypotension  EXAM: PORTABLE CHEST - 1 VIEW  COMPARISON:  None.  FINDINGS: Very low inspiratory volumes with scattered perihilar and bibasilar atelectasis. Mild diffuse interstitial prominence. Cardiac and mediastinal contours are likely within normal limits given low lung volumes and portable frontal technique. No definite airspace consolidation pleural effusion or pneumothorax. No acute osseous abnormality.  IMPRESSION: Very low inspiratory volumes with scattered perihilar and bibasilar atelectasis. Superimposed infiltrate her aspiration is difficult to exclude entirely.  Recommend dedicated PA and lateral chest x-ray when the patient is clinically able.   Electronically Signed   By: Malachy Moan M.D.   On: 10/18/2013 00:38    Microbiology: No results found for this or any previous visit (from the past 240 hour(s)).   Labs: Basic Metabolic Panel:  Recent Labs Lab 10/17/13 2315 10/17/13 2347 10/18/13 0641 10/19/13 0735  NA 138 138 140 140  K 3.2* 4.9 3.8 3.9  CL 99 101 104  107  CO2 19  --  20 22  GLUCOSE 115* 115* 82 87  BUN 12 16 8 8   CREATININE 1.40* 1.70* 0.95 0.80  CALCIUM 9.4  --  8.2* 7.9*  MG  --   --   --  1.7   Liver Function Tests:  Recent Labs Lab 10/17/13 2315 10/18/13 0641 10/19/13 0735  AST 23 20 20   ALT 18 16 12   ALKPHOS 72 71 64  BILITOT 0.3 0.3 0.4  PROT 6.9 6.6 5.7*  ALBUMIN 4.0 3.7 3.1*    Recent Labs Lab 10/18/13 0641  LIPASE 67*  AMYLASE 96   No results found for this basename: AMMONIA,  in the last 168 hours CBC:  Recent Labs Lab 10/17/13 2315 10/17/13 2347 10/18/13 0641 10/19/13 0735  WBC 7.1  --  7.6 5.0  NEUTROABS  --   --  5.7 3.0  HGB 13.2 13.9 11.8* 11.1*  HCT 38.5 41.0 34.2* 31.9*  MCV 93.7  --  92.7 93.8  PLT 301  --  307 277   Cardiac Enzymes:  Recent Labs Lab 10/18/13 0641 10/18/13 1135 10/18/13 1800  TROPONINI <0.30 <0.30 <0.30   BNP: BNP (last 3 results)  Recent Labs  10/18/13 0641  PROBNP 138.1*   CBG: No results found for this basename: GLUCAP,  in the last 168 hours     Signed:  Carolyne Littles, MD [facility] [phone] pager

## 2015-03-03 IMAGING — CT CT HEAD W/O CM
2 series · 16 of 30 positions shown, 20 images · non-contrast
Comparison: None available

CLINICAL DATA: Syncope and

EXAM:
CT HEAD WITHOUT CONTRAST
TECHNIQUE: Contiguous axial images were obtained from the base of the skull
through the vertex without intravenous contrast.

[Series 2: head w/o · axial · non-contrast · 0.48mm/px · z∈[-181,-41]mm · 13 of 34 slices shown, 17 images]
[im 3/34  brain]
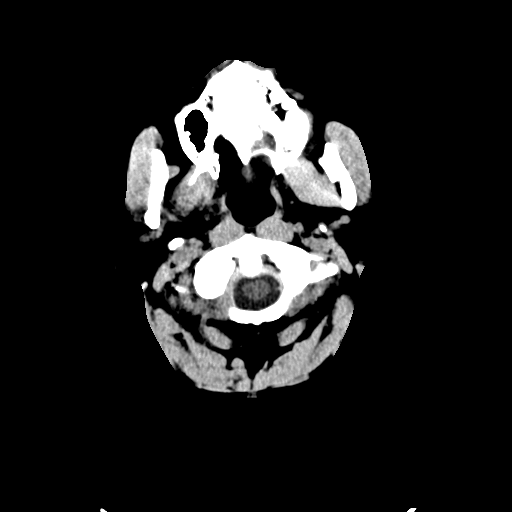
[im 3/34  bone]
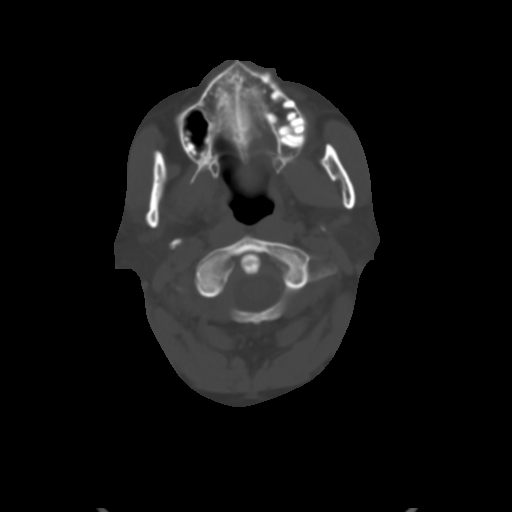
[im 5/34  brain]
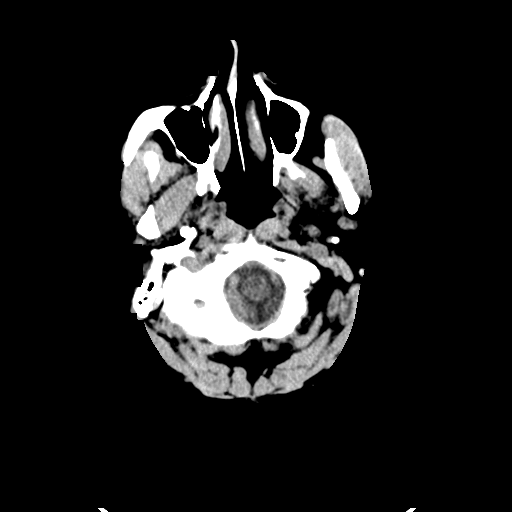
[im 8/34  brain]
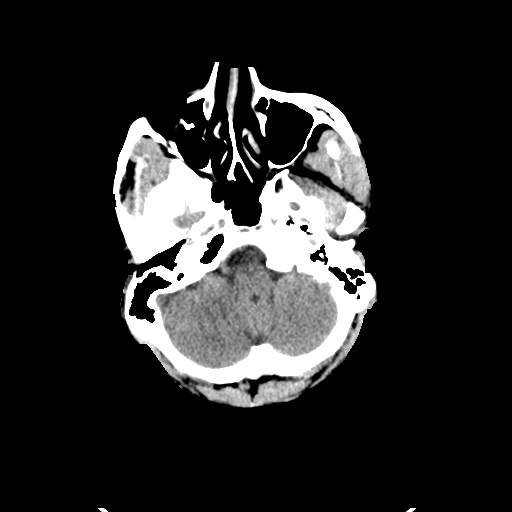
[im 10/34  brain]
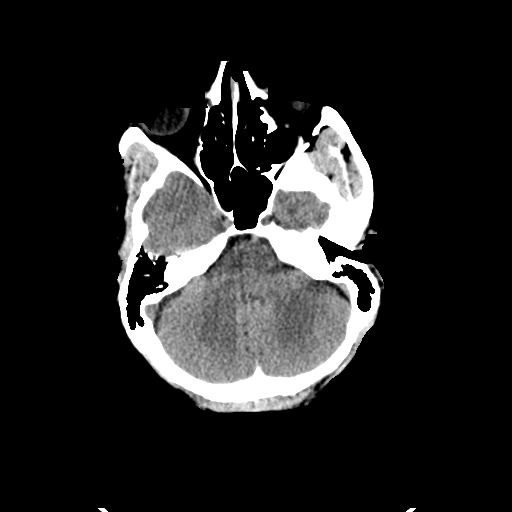
[im 12/34  brain]
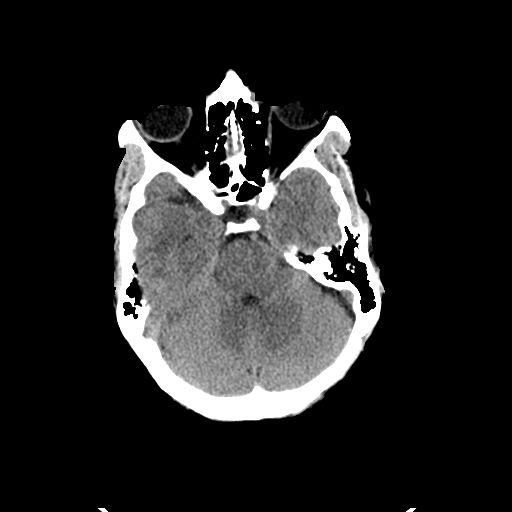
[im 12/34  bone]
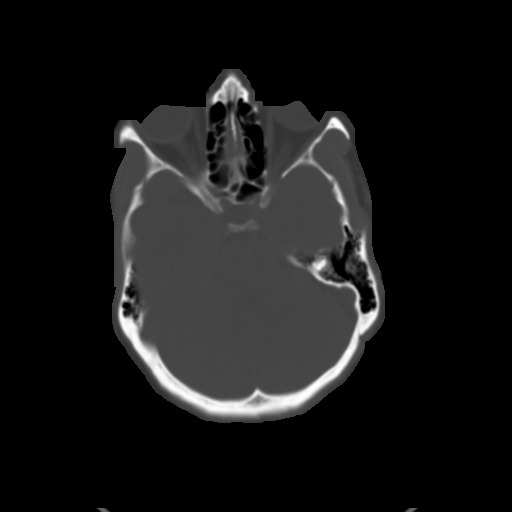
[im 15/34  brain]
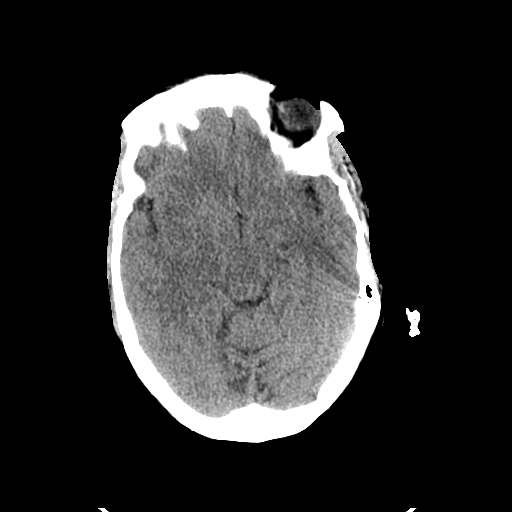
[im 17/34  brain]
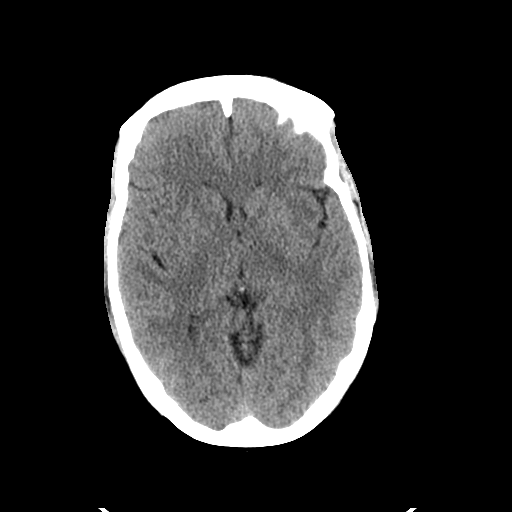
[im 19/34  brain]
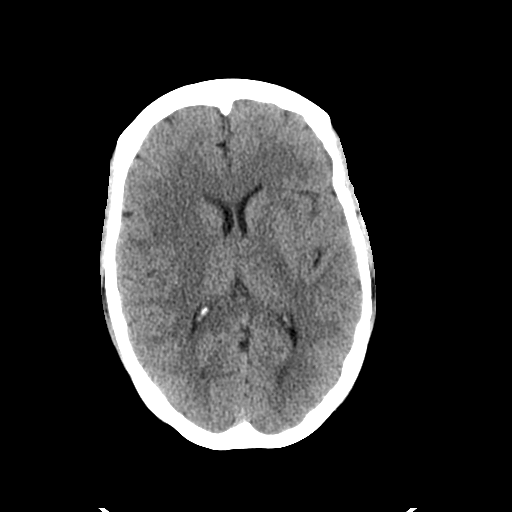
[im 22/34  brain]
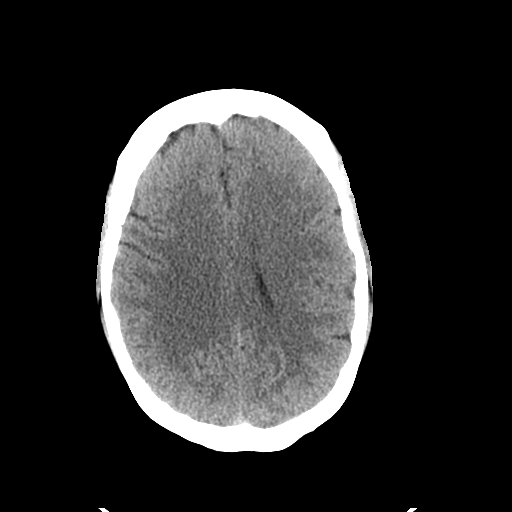
[im 22/34  bone]
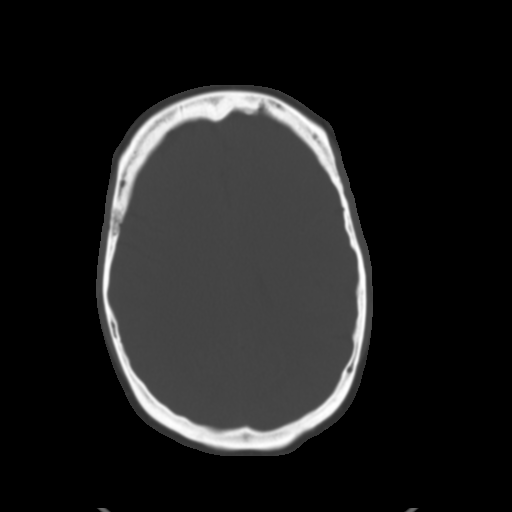
[im 24/34  brain]
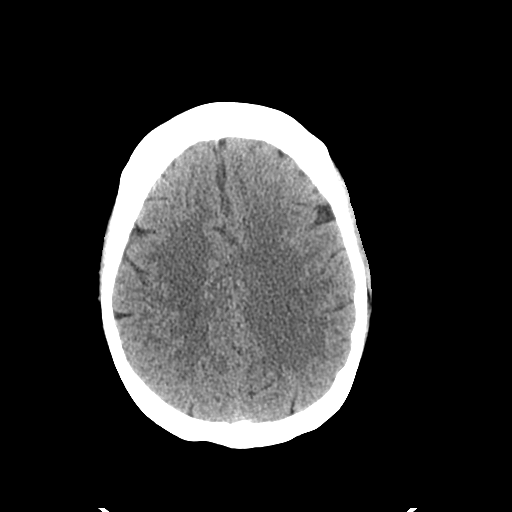
[im 26/34  brain]
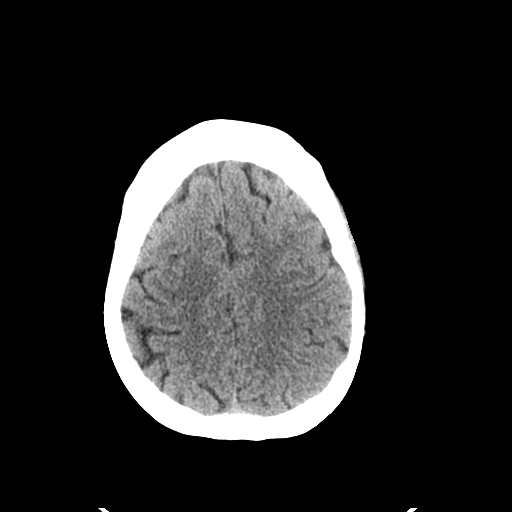
[im 29/34  brain]
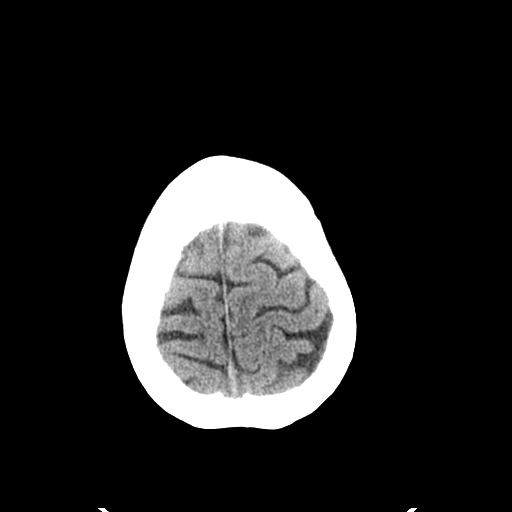
[im 31/34  brain]
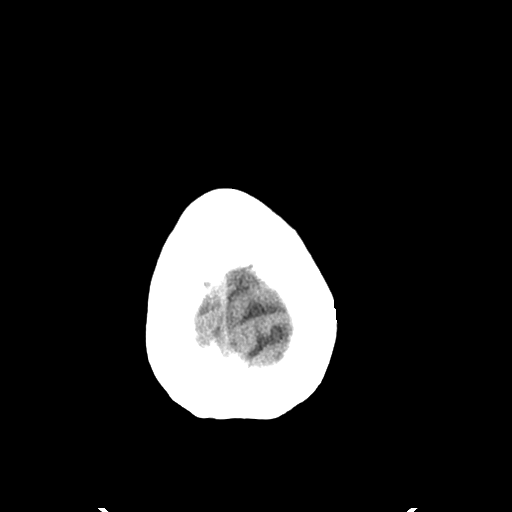
[im 31/34  bone]
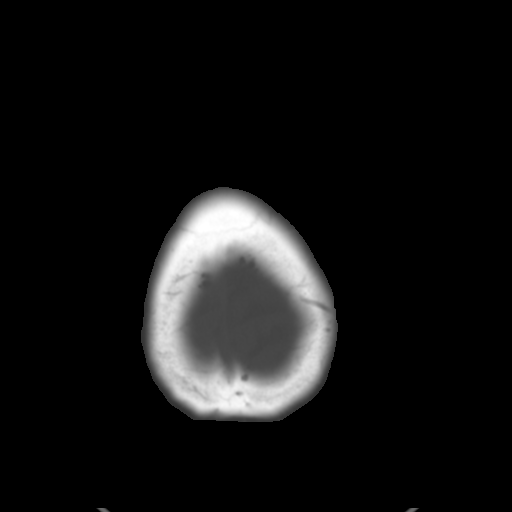

[Series 3: bone windows · axial · 0.48mm/px · z∈[-181,-136]mm · 3 of 34 slices shown]
[im 3/34  bone]
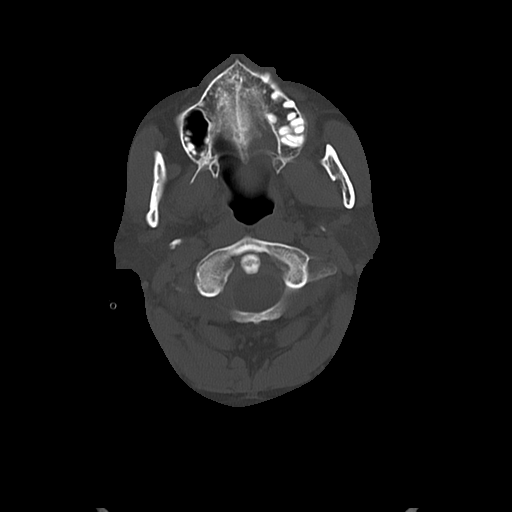
[im 8/34  bone]
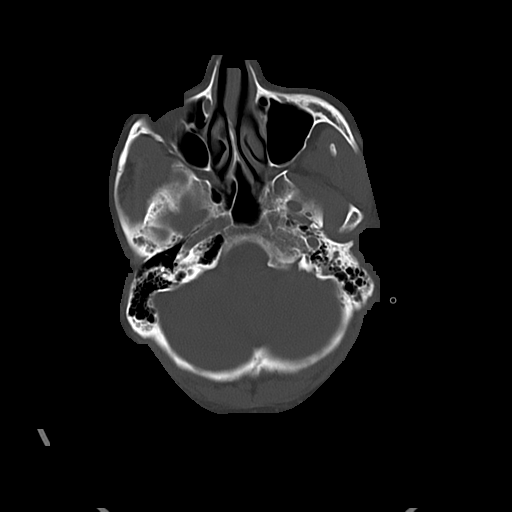
[im 12/34  bone]
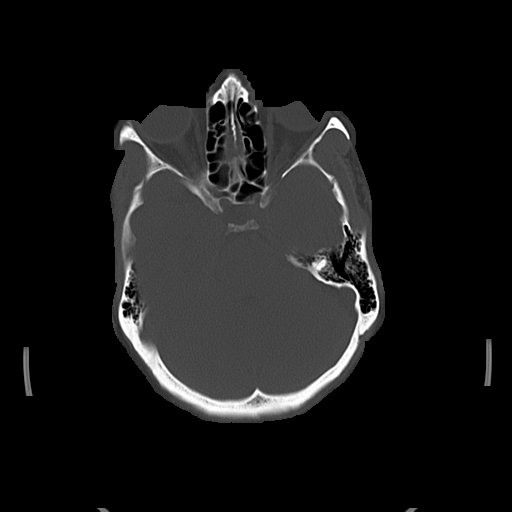

[16 of 30 positions shown; findings below may reference images not displayed]

FINDINGS: There is no acute intracranial hemorrhage or infarct. No mass lesion
or midline shift. Gray-white matter differentiation is well
maintained. Ventricles are normal in size without evidence of
hydrocephalus. CSF containing spaces are within normal limits. No
extra-axial fluid collection.

The calvarium is intact.

Orbital soft tissues are within normal limits.

The paranasal sinuses and mastoid air cells are well pneumatized and
free of fluid.

Scalp soft tissues are unremarkable.
IMPRESSION: No acute intracranial process.

## 2017-02-20 ENCOUNTER — Ambulatory Visit: Admit: 2017-02-20 | Discharge: 2017-02-20 | Payer: PRIVATE HEALTH INSURANCE | Attending: Urology

## 2017-02-20 DIAGNOSIS — R109 Unspecified abdominal pain: Secondary | ICD-10-CM

## 2017-02-20 LAB — AMB POC URINALYSIS DIP STICK AUTO W/ MICRO (MICRO RESULTS)
Bilirubin (UA POC): NEGATIVE
Crystals (UA POC): NEGATIVE
Epithelial cells (UA POC): 0
Glucose (UA POC): NEGATIVE
Ketones (UA POC): NEGATIVE
Leukocyte esterase (UA POC): NEGATIVE
Nitrites (UA POC): NEGATIVE
Protein (UA POC): NEGATIVE
RBCs (UA POC): 0
Specific gravity (UA POC): 1.02 (ref 1.001–1.035)
Urobilinogen (UA POC): 0.2 (ref 0.2–1)
WBCs (UA POC): 0
pH (UA POC): 7 (ref 4.6–8.0)

## 2017-02-20 NOTE — Progress Notes (Signed)
HISTORY OF PRESENT ILLNESS:  Claudia Bird is a 51 y.o. female who is referred by [place], NP for consultation for ?kidney stone. Had a KUB in March that was neg. No hx of stones prior to this episode. Was seen at [facility] and KUB reports possible stones. Has been having moderate right flank pain. Has seen some blood when she urinates and in her underwear.    Past Medical History:   Diagnosis Date   ??? Gallstone    ??? Hematuria, microscopic    ??? Hypertension    ??? UTI (urinary tract infection)        Past Surgical History:   Procedure Laterality Date   ??? HX HERNIA REPAIR     ??? HX TUBAL LIGATION     ??? LAP,CHOLECYSTECTOMY         Social History   Substance Use Topics   ??? Smoking status: Never Smoker   ??? Smokeless tobacco: Never Used   ??? Alcohol use Yes       Allergies   Allergen Reactions   ??? Zithromax [Azithromycin] Unable to Obtain       Family History   Problem Relation Age of Onset   ??? Hypertension Mother    ??? Hypertension Father        Current Outpatient Prescriptions   Medication Sig Dispense Refill   ??? clobetasol (TEMOVATE) 0.05 % topical cream      ??? FLUoxetine (PROZAC) 20 mg capsule      ??? TRIBENZOR 20-5-12.5 mg tab          Review of Systems  Constitutional: Fever: No  Skin: Rash: No  HEENT: Hearing difficulty: No  Eyes: Blurred vision: No  Cardiovascular: Chest pain: No  Respiratory: Shortness of breath: No  Gastrointestinal: Nausea/vomiting: No  Musculoskeletal: Back pain: Yes  Neurological: Weakness: No  Psychological: Memory loss: No  Comments/additional findings:       PHYSICAL EXAMINATION:     Visit Vitals   ??? BP 142/78 (BP 1 Location: Right arm, BP Patient Position: Sitting)   ??? Pulse 78   ??? Temp 98.1 ??F (36.7 ??C) (Temporal)   ??? Resp 18   ??? Ht 5\' 7"  (1.702 m)   ??? Wt 175 lb (79.4 kg)   ??? BMI 27.41 kg/m2     Constitutional: Well developed, no acute distress.   Eyes:  Conjunctiva normal.  Ears:  External ear normal.  Nose/Throat:  External nose normal.   CV:  Heart rate regular. No peripheral swelling noted.  Respiratory: No respiratory distress. No audible wheeze.  Abdomen:  Soft, nontender, nondistended, no mass/organomegaly.  Vascular:  Abdominal Aorta nonpalpable.  Skin: No rash. No ulcer.    Neuro/Psych:  Patient with appropriate affect.  Alert and oriented x 3.    Gait:  Normal.  GU Female:      CVA: non-tender bilaterally. Bladder not palpable.      REVIEW OF LABS AND IMAGING:      Results for orders placed or performed in visit on 02/20/17   AMB POC URINALYSIS DIP STICK AUTO W/ MICRO (MICRO RESULTS)   Result Value Ref Range    Color (UA POC) Yellow     Clarity (UA POC) Clear     Glucose (UA POC) Negative Negative    Bilirubin (UA POC) Negative Negative    Ketones (UA POC) Negative Negative    Specific gravity (UA POC) 1.020 1.001 - 1.035    Blood (UA POC) 3+ Negative  pH (UA POC) 7.0 4.6 - 8.0    Protein (UA POC) Negative Negative    Urobilinogen (UA POC) 0.2 mg/dL 0.2 - 1    Nitrites (UA POC) Negative Negative    Leukocyte esterase (UA POC) Negative Negative    Epithelial cells (UA POC) 0     WBCs (UA POC) 0      RBCs (UA POC) 0      Bacteria (UA POC) None Negative    Crystals (UA POC) Negative Negative    Other (UA POC)         ASSESSMENT:     ICD-10-CM ICD-9-CM    1. Right flank pain R10.9 789.09 AMB POC URINALYSIS DIP STICK AUTO W/ MICRO (MICRO RESULTS)      US RETROPERITONEUM COMP       PLAN:    ?? Schedule for renal ultrasound first per pt's insurance requirements  ?? If neg, will schedule for CT Stone Study  ?? F/u after    Patient's BMI is out of the normal parameters.  Information about BMI was given and patient was advised to follow-up with their PCP for further management.    Chief Complaint   Patient presents with   ??? Kidney SunGardStone     Medical documentation provided with the assistance of Isac SarnaKimber N Moore, medical scribe for [employer] LifeChristi Renarda Mullinix, DO, [address] Bow Ridge LaneFACOS     Tristian Sickinger [place], [place]

## 2017-02-23 ENCOUNTER — Encounter

## 2017-02-28 ENCOUNTER — Telehealth

## 2017-02-28 ENCOUNTER — Encounter: Attending: Urology

## 2017-02-28 NOTE — Telephone Encounter (Signed)
Renal ultrasound neg, so will schedule CT Stone Study.

## 2017-03-06 ENCOUNTER — Encounter

## 2017-03-09 ENCOUNTER — Ambulatory Visit: Admit: 2017-03-09 | Discharge: 2017-03-09 | Payer: PRIVATE HEALTH INSURANCE | Attending: Urology

## 2017-03-09 DIAGNOSIS — R109 Unspecified abdominal pain: Secondary | ICD-10-CM

## 2017-03-09 NOTE — Progress Notes (Signed)
HISTORY OF PRESENT ILLNESS:  Claudia Bird is a 51 y.o. female who presents today for f/u renal ultrasound and CT done for c/o right flank pain. Previous neg KUB. Renal US and CT neg urologically, but notes height loss of L2 vertebral body.     Pt is still having moderate right flank pain. No change since last visit.    AUA Symptom Score 03/09/2017   Over the past month how often have you had the sensation that your bladder was not completely empty after you finished urinating? 0   Over the past month, how often have had to urinate again less than 2 hours after you last finished urinating? 1   Over the past month, how often have you found you stopped and started again several times when you urinated? 0   Over the past month, how often have you found it difficult to postpone urination? 0   Over the past month, how often have you had a weak urinary stream? 0   Over the past month, how often have you had to push or strain to begin urinating? 0   Over the past month, how many times did you most typically get up to urinate from the time you went to bed at night until the time you got up in the morning? 3   AUA Score 4   If you were to spend the rest of your life with your urinary condition the way it is now, how would you feel about that? Terrible       Past Medical History:   Diagnosis Date   ??? Gallstone    ??? Hematuria, microscopic    ??? Hypertension    ??? UTI (urinary tract infection)        Past Surgical History:   Procedure Laterality Date   ??? HX HERNIA REPAIR     ??? HX TUBAL LIGATION     ??? LAP,CHOLECYSTECTOMY         Social History   Substance Use Topics   ??? Smoking status: Never Smoker   ??? Smokeless tobacco: Never Used   ??? Alcohol use Yes       Allergies   Allergen Reactions   ??? Zithromax [Azithromycin] Unable to Obtain       Family History   Problem Relation Age of Onset   ??? Hypertension Mother    ??? Hypertension Father        Current Outpatient Prescriptions   Medication Sig Dispense Refill    ??? clobetasol (TEMOVATE) 0.05 % topical cream      ??? FLUoxetine (PROZAC) 20 mg capsule      ??? TRIBENZOR 20-5-12.5 mg tab          REVIEW OF SYSTEMS:  Constitutional: Fever: No  Skin: Rash: No  HEENT: Hearing difficulty: No  Eyes: Blurred vision: No  Cardiovascular: Chest pain: No  Respiratory: Shortness of breath: No  Gastrointestinal: Nausea/vomiting: No  Musculoskeletal: Back pain: No  Neurological: Weakness: No  Psychological: Memory loss: No  Comments/additional findings:       PHYSICAL EXAMINATION:   Visit Vitals   ??? BP 112/82 (BP 1 Location: Left arm, BP Patient Position: Sitting)   ??? Pulse 88   ??? Temp 98.2 ??F (36.8 ??C) (Temporal)   ??? Resp 18   ??? Ht 5\' 7"  (1.702 m)   ??? Wt 178 lb (80.7 kg)   ??? BMI 27.88 kg/m2     Constitutional: Well developed, no acute distress.   Eyes:  Conjunctiva normal.  Ears:  External ear normal.  Nose/Throat:  External nose normal.  CV:  Heart rate regular. No peripheral swelling noted.  Respiratory: No respiratory distress. No audible wheeze.  Skin: No rash. No ulcer.    Neuro/Psych:  Patient with appropriate affect.  Alert and oriented x 3.    Gait:  Normal.      REVIEW OF LABS AND IMAGING:    No labs or radiology this visit.      ASSESSMENT:     ICD-10-CM ICD-9-CM    1. Right flank pain R10.9 789.09         PLAN:    ?? Offered referral to physical therapy, but not interested at this time  ?? F/u PRN    Patient's BMI is out of the normal parameters.  Information about BMI was given and patient was advised to follow-up with their PCP for further management.    Chief Complaint   Patient presents with   ??? Flank Pain     f/u      Medical documentation provided with the assistance of Isac SarnaKimber N Moore, medical scribe for [employer] LifeChristi Auriel Kist, DO, [place]FAC[place].    Tung Pustejovsky [place] Knoll[place]ughart, DO
